# Patient Record
Sex: Female | Born: 1995 | Race: Black or African American | Hispanic: No | Marital: Single | Smoking: Never smoker
Health system: Southern US, Community
[De-identification: ages and names within clinical notes are randomized; demographics above are authoritative.]

## PROBLEM LIST (undated history)

## (undated) DIAGNOSIS — E282 Polycystic ovarian syndrome: Secondary | ICD-10-CM

## (undated) DIAGNOSIS — I10 Essential (primary) hypertension: Secondary | ICD-10-CM

---
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
## 2017-04-12 ENCOUNTER — Encounter (HOSPITAL_COMMUNITY): Payer: Self-pay | Admitting: Emergency Medicine

## 2017-04-12 DIAGNOSIS — R112 Nausea with vomiting, unspecified: Secondary | ICD-10-CM | POA: Diagnosis not present

## 2017-04-12 DIAGNOSIS — R42 Dizziness and giddiness: Secondary | ICD-10-CM | POA: Insufficient documentation

## 2017-04-12 LAB — URINALYSIS, ROUTINE W REFLEX MICROSCOPIC
BILIRUBIN URINE: NEGATIVE
Glucose, UA: NEGATIVE mg/dL
Hgb urine dipstick: NEGATIVE
KETONES UR: NEGATIVE mg/dL
LEUKOCYTES UA: NEGATIVE
NITRITE: NEGATIVE
Protein, ur: NEGATIVE mg/dL
SPECIFIC GRAVITY, URINE: 1.019 (ref 1.005–1.030)
pH: 6 (ref 5.0–8.0)

## 2017-04-12 LAB — PREGNANCY, URINE: PREG TEST UR: NEGATIVE

## 2017-04-12 NOTE — ED Triage Notes (Addendum)
Pt reports she was sitting at the desk and suddenly became dizzy and nauseas.  Pt reports she "might be getting a headache."  No other complaints at this time.

## 2017-04-13 ENCOUNTER — Emergency Department (HOSPITAL_COMMUNITY)
Admission: EM | Admit: 2017-04-13 | Discharge: 2017-04-13 | Disposition: A | Discharge status: Home / Self Care | Payer: Medicaid Other | Attending: Emergency Medicine | Admitting: Emergency Medicine

## 2017-04-13 DIAGNOSIS — R112 Nausea with vomiting, unspecified: Secondary | ICD-10-CM

## 2017-04-13 DIAGNOSIS — R42 Dizziness and giddiness: Secondary | ICD-10-CM

## 2017-04-13 MED ORDER — ONDANSETRON 4 MG PO TBDP: 8.0000 mg | ORAL_TABLET | Freq: Once | ORAL | Status: DC

## 2017-04-13 MED ORDER — ONDANSETRON 4 MG PO TBDP: 4.0000 mg | ORAL_TABLET | Freq: Three times a day (TID) | ORAL | 0 refills | Status: AC | PRN

## 2017-04-13 MED ORDER — IBUPROFEN 400 MG PO TABS: 600.0000 mg | ORAL_TABLET | Freq: Once | ORAL | Status: DC

## 2017-04-13 NOTE — ED Provider Notes (Signed)
[facility] Provider Note   CSN: [id number] Arrival date & time: 04/12/17  2232     History   Chief Complaint Chief Complaint  Patient presents with  . Dizziness  . Nausea    HPI Jody George is a 21 y.o. female who presents with lightheadedness, headache, N/V, and upper abdominal pain. No significant PMH. She states she felt acutely lightheaded while in her dorm room tonight. She is a Consulting civil engineer at [employer]. She started to feel hot and cold and then nauseous as well and had one episode of vomiting. She has a frontal headache and epigastric abdominal pain as well. She had a cold last week but denies any other sick contacts. She denies URI symptoms, chest pain, SOB, cough, lower abdominal pain, diarrhea, dysuria, vaginal discharge.  HPI  History reviewed. No pertinent past medical history.  There are no active problems to display for this patient.   History reviewed. No pertinent surgical history.  OB History    No data available       Home Medications    Prior to Admission medications   Medication Sig Start Date End Date Taking? Authorizing Provider  ondansetron (ZOFRAN ODT) 4 MG disintegrating tablet Take 1 tablet (4 mg total) by mouth every 8 (eight) hours as needed for nausea or vomiting. 04/13/17   Bethel Born, PA-C    Family History No family history on file.  Social History Social History  Substance Use Topics  . Smoking status: Never Smoker  . Smokeless tobacco: Not on file  . Alcohol use No     Allergies   Patient has no known allergies.   Review of Systems Review of Systems  Constitutional:       +hot and cold  HENT: Negative for congestion, ear pain, rhinorrhea and sore throat.   Respiratory: Negative for cough and shortness of breath.   Cardiovascular: Negative for chest pain.  Gastrointestinal: Positive for abdominal pain, nausea and vomiting. Negative for diarrhea.  Genitourinary: Negative for dysuria.  Neurological:  Positive for light-headedness and headaches. Negative for syncope.  All other systems reviewed and are negative.    Physical Exam Updated Vital Signs BP (!) 151/85 (BP Location: Right Arm)   Pulse 87   Temp 97.8 F (36.6 C) (Oral)   Resp 16   Ht 5' 6.5" (1.689 m)   Wt 117.9 kg (260 lb)   LMP 03/16/2017 (Approximate)   SpO2 100%   BMI 41.34 kg/m   Physical Exam  Constitutional: She is oriented to person, place, and time. She appears well-developed and well-nourished. No distress.  Obese female in NAD  HENT:  Head: Normocephalic and atraumatic.  Right Ear: Hearing, tympanic membrane, external ear and ear canal normal.  Left Ear: Hearing, tympanic membrane, external ear and ear canal normal.  Nose: Nose normal.  Mouth/Throat: Uvula is midline, oropharynx is clear and moist and mucous membranes are normal.  Forehead tenderness  Eyes: Pupils are equal, round, and reactive to light. Conjunctivae are normal. Right eye exhibits no discharge. Left eye exhibits no discharge. No scleral icterus.  Neck: Normal range of motion.  Cardiovascular: Normal rate and regular rhythm.  Exam reveals no gallop and no friction rub.   No murmur heard. Pulmonary/Chest: Effort normal and breath sounds normal. No respiratory distress. She has no wheezes. She has no rales. She exhibits no tenderness.  Abdominal: Soft. Bowel sounds are normal. She exhibits no distension and no mass. There is tenderness (epigastric tenderness). There is no rebound  and no guarding. No hernia.  Lymphadenopathy:    She has no cervical adenopathy.  Neurological: She is alert and oriented to person, place, and time.  Skin: Skin is warm and dry.  Psychiatric: She has a normal mood and affect. Her behavior is normal.  Nursing note and vitals reviewed.    ED Treatments / Results  Labs (all labs ordered are listed, but only abnormal results are displayed) Labs Reviewed  URINALYSIS, ROUTINE W REFLEX MICROSCOPIC  PREGNANCY,  URINE    EKG  EKG Interpretation None       Radiology No results found.  Procedures Procedures (including critical care time)  Medications Ordered in ED Medications  ondansetron (ZOFRAN-ODT) disintegrating tablet 8 mg (not administered)  ibuprofen (ADVIL,MOTRIN) tablet 600 mg (not administered)     Initial Impression / Assessment and Plan / ED Course  I have reviewed the triage vital signs and the nursing notes.  Pertinent labs & imaging results that were available during my care of the patient were reviewed by me and considered in my medical decision making (see chart for details).  21 year old female presents with acute onset of lightheadedness, headache, epigastric pain, N/V with hot/cold spells. Likely viral illness. Abdomen is soft, benign. Exam is overall unremarkable. UA is clean and she is not pregnant. Offered fluids, pain meds, antiemetics vs d/c home with OTC meds. She prefers d/c home. Will rx Zofran. Return precautions given.  Final Clinical Impressions(s) / ED Diagnoses   Final diagnoses:  Dizziness  Nausea and vomiting, intractability of vomiting not specified, unspecified vomiting type    New Prescriptions New Prescriptions   ONDANSETRON (ZOFRAN ODT) 4 MG DISINTEGRATING TABLET    Take 1 tablet (4 mg total) by mouth every 8 (eight) hours as needed for nausea or vomiting.     Bethel Born, PA-C 04/13/17 1610    Shon Baton, MD 04/14/17 [phone]

## 2017-04-13 NOTE — ED Notes (Signed)
See providers notes

## 2017-04-13 NOTE — Discharge Instructions (Signed)
Please rest and drink plenty of fluids Take Ibuprofen or Tylenol for pain/fever Take Zofran for nausea Return for worsening symptoms

## 2018-03-08 ENCOUNTER — Ambulatory Visit (HOSPITAL_COMMUNITY)
Admission: EM | Admit: 2018-03-08 | Discharge: 2018-03-08 | Disposition: A | Discharge status: Home / Self Care | Payer: Medicaid Other | Attending: Family Medicine | Admitting: Family Medicine

## 2018-03-08 ENCOUNTER — Other Ambulatory Visit: Payer: Self-pay

## 2018-03-08 ENCOUNTER — Encounter (HOSPITAL_COMMUNITY): Payer: Self-pay | Admitting: Emergency Medicine

## 2018-03-08 DIAGNOSIS — N61 Mastitis without abscess: Secondary | ICD-10-CM

## 2018-03-08 DIAGNOSIS — R030 Elevated blood-pressure reading, without diagnosis of hypertension: Secondary | ICD-10-CM

## 2018-03-08 DIAGNOSIS — N644 Mastodynia: Secondary | ICD-10-CM

## 2018-03-08 MED ORDER — KETOROLAC TROMETHAMINE 30 MG/ML IJ SOLN
INTRAMUSCULAR | Status: AC
  Filled 2018-03-08: qty 1

## 2018-03-08 MED ORDER — IBUPROFEN 600 MG PO TABS: 600.0000 mg | ORAL_TABLET | Freq: Four times a day (QID) | ORAL | 0 refills | Status: AC | PRN

## 2018-03-08 MED ORDER — KETOROLAC TROMETHAMINE 30 MG/ML IJ SOLN
30.0000 mg | Freq: Once | INTRAMUSCULAR | Status: AC
  Administered 2018-03-08: 30 mg via INTRAMUSCULAR

## 2018-03-08 MED ORDER — CLINDAMYCIN HCL 300 MG PO CAPS: 300.0000 mg | ORAL_CAPSULE | Freq: Four times a day (QID) | ORAL | 0 refills | Status: AC

## 2018-03-08 NOTE — ED Provider Notes (Addendum)
[facility]    CSN: [id number] Arrival date & time: 03/08/18  1744     History   Chief Complaint No chief complaint on file.   HPI Jody George is a 22 y.o. female.   The history is provided by the patient. No language interpreter was used.  Abscess  Abscess location: Right breast swelling and pain. Abscess quality: painful, redness and warmth   Abscess quality: not draining, no fluctuance and no itching   Red streaking: no   Duration:  1 week Progression:  Worsening Pain details:    Quality:  Hot and throbbing   Severity:  Severe (8/10 in severity)   Timing:  Constant   Subjective pain progression: Positioning aggravates the pain. Chronicity:  Recurrent (Right breast abscess in the past about 1 yr ago. It resolved without treatment) Context: not diabetes, not injected drug use, not insect bite/sting and not skin injury   Relieved by:  Nothing Exacerbated by: positioning. Ineffective treatments:  NSAIDs (Hot towel and home remedies) Associated symptoms: no headaches   Hypertension  This is a recurrent (Was told she has high BP 2 years ago) problem. The problem occurs rarely. Pertinent negatives include no chest pain, no abdominal pain, no headaches and no shortness of breath. Associated symptoms comments: Intermittent headache. Last was last night. She treated it with Ibuprofen.. Nothing aggravates the symptoms. Nothing relieves the symptoms. Treatments tried: Ibuprofen. The treatment provided significant relief.    No past medical history on file.  There are no active problems to display for this patient.   No past surgical history on file.  OB History   None      Home Medications    Prior to Admission medications   Medication Sig Start Date End Date Taking? Authorizing Provider  ondansetron (ZOFRAN ODT) 4 MG disintegrating tablet Take 1 tablet (4 mg total) by mouth every 8 (eight) hours as needed for nausea or vomiting. 04/13/17    Bethel BornGekas, Kelly Marie, PA-C    Family History No family history on file.  Social History Social History   Tobacco Use  . Smoking status: Never Smoker  Substance Use Topics  . Alcohol use: No  . Drug use: No     Allergies   Patient has no known allergies.   Review of Systems Review of Systems  Respiratory: Negative.  Negative for shortness of breath.   Cardiovascular: Negative.  Negative for chest pain.  Gastrointestinal: Negative for abdominal pain.  Skin:       Right breast swelling and pain.  Neurological: Negative.  Negative for headaches.  All other systems reviewed and are negative.    Physical Exam Triage Vital Signs ED Triage Vitals  Enc Vitals Group     BP      Pulse      Resp      Temp      Temp src      SpO2      Weight      Height      Head Circumference      Peak Flow      Pain Score      Pain Loc      Pain Edu?      Excl. in GC?    No data found.  Updated Vital Signs There were no vitals taken for this visit.  Visual Acuity Right Eye Distance:   Left Eye Distance:   Bilateral Distance:    Right Eye Near:   Left  Eye Near:    Bilateral Near:     Physical Exam  Constitutional: She appears well-developed. No distress.  Cardiovascular: Normal rate, regular rhythm and normal heart sounds.  No murmur heard. Pulmonary/Chest: Effort normal and breath sounds normal. No stridor. No respiratory distress. Right breast exhibits tenderness. Right breast exhibits no inverted nipple, no mass and no nipple discharge. Left breast exhibits no inverted nipple, no mass, no nipple discharge, no skin change and no tenderness. There is breast swelling.    Abdominal: Soft. She exhibits no distension and no mass. There is no tenderness.  Musculoskeletal: She exhibits no edema.  Nursing note and vitals reviewed.    UC Treatments / Results  Labs (all labs ordered are listed, but only abnormal results are displayed) Labs Reviewed - No data to  display  EKG None  Radiology No results found.  Procedures Procedures (including critical care time)  Medications Ordered in UC Medications - No data to display  Initial Impression / Assessment and Plan / UC Course  I have reviewed the triage vital signs and the nursing notes.  Pertinent labs & imaging results that were available during my care of the patient were reviewed by me and considered in my medical decision making (see chart for details).  Clinical Course as of Mar 09 1839  Mon Mar 08, 2018  2130 Right breast cellulitis. Start Clindamycin for 10 days. Ibuprofen 600 mg prn pain. Toradol injection given during this visit. I recommended breast US evaluation especially since this is recurrent. I doubt she will tolerate breast US currently given tenderness She is instructed to establish PCP care for reeval and [place] referral. I gave list of PCP including surgeon for referral. Return precaution discussed. ED visit if symptoms worsens. Patient and her mother agreed with the plan.   [KE]  1837 Initial BP measured was elevated. Likely due to pain. Improved with re-measurement. PCP f/u for monitoring and management recommended.   [KE]    Clinical Course User Index [KE] Doreene Eland, MD    Cellulitis of right breast  Elevated BP without diagnosis of hypertension   Final Clinical Impressions(s) / UC Diagnoses   Final diagnoses:  None   Discharge Instructions   None    ED Prescriptions    None     Controlled Substance Prescriptions [place] Controlled Substance Registry consulted? Not Applicable   Doreene Eland, MD 03/08/18 1840    Doreene Eland, MD 03/08/18 [phone]

## 2018-03-08 NOTE — ED Triage Notes (Signed)
Abscess to right breast.  Patient noticed it late last week.  Abscess is getting larger and more painful each day.  History of the same, but they normally open up by Dorthula Perfecttheslves

## 2018-03-08 NOTE — Discharge Instructions (Addendum)
It was nice seeing you today. Your breast exam indicates that you have breast skin infection without obvious pus collection. Let us treat you with pain medicine and antibiotics. If no improvement in 1 week or symptoms worsens, I will recommend a breast ultrasound. Please establish PCP care for breast US referral or Surgery care for US and drainage if present following US. Use Ibuprofen as needed for pain.  [facility] Address: [address], [place], [place] [address] Phone: [phone]   [facility] Address: [address] [place], [place], [place] [address] Phone: [phone]

## 2020-11-07 ENCOUNTER — Ambulatory Visit
Admission: EM | Admit: 2020-11-07 | Discharge: 2020-11-07 | Disposition: A | Discharge status: Home / Self Care | Payer: BLUE CROSS/BLUE SHIELD | Attending: Family Medicine | Admitting: Family Medicine

## 2020-11-07 ENCOUNTER — Other Ambulatory Visit: Payer: Self-pay

## 2020-11-07 ENCOUNTER — Encounter: Payer: Self-pay | Admitting: Emergency Medicine

## 2020-11-07 DIAGNOSIS — S20212A Contusion of left front wall of thorax, initial encounter: Secondary | ICD-10-CM

## 2020-11-07 MED ORDER — CYCLOBENZAPRINE HCL 5 MG PO TABS: 5.0000 mg | ORAL_TABLET | Freq: Three times a day (TID) | ORAL | 0 refills | Status: AC | PRN

## 2020-11-07 MED ORDER — NAPROXEN 500 MG PO TABS: 500.0000 mg | ORAL_TABLET | Freq: Two times a day (BID) | ORAL | 0 refills | Status: AC | PRN
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
## 2020-11-19 ENCOUNTER — Other Ambulatory Visit: Payer: Self-pay | Admitting: Family Medicine

## 2024-08-12 ENCOUNTER — Encounter (HOSPITAL_BASED_OUTPATIENT_CLINIC_OR_DEPARTMENT_OTHER): Payer: Self-pay | Admitting: Emergency Medicine

## 2024-08-12 ENCOUNTER — Other Ambulatory Visit: Payer: Self-pay

## 2024-08-12 DIAGNOSIS — I10 Essential (primary) hypertension: Secondary | ICD-10-CM | POA: Diagnosis not present

## 2024-08-12 DIAGNOSIS — R519 Headache, unspecified: Secondary | ICD-10-CM | POA: Diagnosis present

## 2024-08-12 NOTE — ED Triage Notes (Signed)
 Headache started around 7 AM  Seen by new PCP today, prescribed new BP meds Nifedipine  has not started yet  Took ibuprofen  around 12  Seen at [facility] and decline med.

## 2024-08-13 ENCOUNTER — Emergency Department (HOSPITAL_BASED_OUTPATIENT_CLINIC_OR_DEPARTMENT_OTHER)
Admission: EM | Admit: 2024-08-13 | Discharge: 2024-08-13 | Disposition: A | Source: Ambulatory Visit | Attending: Emergency Medicine | Admitting: Emergency Medicine

## 2024-08-13 DIAGNOSIS — I10 Essential (primary) hypertension: Secondary | ICD-10-CM

## 2024-08-13 DIAGNOSIS — R519 Headache, unspecified: Secondary | ICD-10-CM

## 2024-08-13 HISTORY — DX: Polycystic ovarian syndrome: E28.2

## 2024-08-13 HISTORY — DX: Essential (primary) hypertension: I10

## 2024-08-13 MED ORDER — KETOROLAC TROMETHAMINE 30 MG/ML IJ SOLN
30.0000 mg | Freq: Once | INTRAMUSCULAR | Status: AC
  Administered 2024-08-13: 30 mg via INTRAMUSCULAR
  Filled 2024-08-13: qty 1

## 2024-08-13 MED ORDER — NIFEDIPINE ER OSMOTIC RELEASE 30 MG PO TB24
30.0000 mg | ORAL_TABLET | Freq: Once | ORAL | Status: AC
  Administered 2024-08-13: 30 mg via ORAL
  Filled 2024-08-13: qty 1

## 2024-08-13 NOTE — ED Provider Notes (Signed)
 " [place] [facility] AT [facility] Provider Note   CSN: [id number] Arrival date & time: 08/12/24  2036     Patient presents with: Headache   Jody George is a 29 y.o. female.   HPI     This is a 29 year old female who presents with concern for headache.  Patient reports that she occasionally gets headaches and this usually correlates with her blood pressure.  She had been off blood pressure medication.  She did see her primary doctor and had a prescription for nifedipine ; however, she was unable to pick it up today.  She continued to have headache despite taking over-the-counter medications.  Describes headache as frontal headache.  No weakness numbness, tingling, speech difficulty.  Prior to Admission medications  Medication Sig Start Date End Date Taking? Authorizing Provider  clindamycin  (CLEOCIN ) 300 MG capsule Take 1 capsule (300 mg total) by mouth every 6 (six) hours. Patient not taking: Reported on 11/07/2020 03/08/18   Anders Otto DASEN, MD  cyclobenzaprine  (FLEXERIL ) 5 MG tablet Take 1 tablet (5 mg total) by mouth 3 (three) times daily as needed for muscle spasms. Do not drink alcohol or drive while taking this medication. May cause drowsiness 11/07/20   Stuart Vernell Norris, PA-C  ibuprofen  (ADVIL ,MOTRIN ) 600 MG tablet Take 1 tablet (600 mg total) by mouth every 6 (six) hours as needed. 03/08/18   Anders Otto DASEN, MD  naproxen  (NAPROSYN ) 500 MG tablet Take 1 tablet (500 mg total) by mouth 2 (two) times daily as needed. 11/07/20   Stuart Vernell Norris, PA-C  NON FORMULARY     [provider]  ondansetron  (ZOFRAN  ODT) 4 MG disintegrating tablet Take 1 tablet (4 mg total) by mouth every 8 (eight) hours as needed for nausea or vomiting. 04/13/17   Odis Burnard Jansky, PA-C    Allergies: Patient has no known allergies.    Review of Systems  Constitutional:  Negative for fever.  Cardiovascular:  Negative for chest pain.  Neurological:   Positive for headaches. Negative for weakness and numbness.  All other systems reviewed and are negative.   Updated Vital Signs BP (!) 144/97   Pulse 80   Temp 98 F (36.7 C) (Oral)   Resp 18   LMP 08/03/2024   SpO2 100%   Physical Exam Vitals and nursing note reviewed.  Constitutional:      Appearance: She is well-developed. She is obese. She is not ill-appearing.  HENT:     Head: Normocephalic and atraumatic.  Eyes:     Pupils: Pupils are equal, round, and reactive to light.  Cardiovascular:     Rate and Rhythm: Normal rate and regular rhythm.     Heart sounds: Normal heart sounds.  Pulmonary:     Effort: Pulmonary effort is normal. No respiratory distress.     Breath sounds: No wheezing.  Abdominal:     General: Bowel sounds are normal.     Palpations: Abdomen is soft.  Musculoskeletal:     Cervical back: Neck supple.  Skin:    General: Skin is warm and dry.  Neurological:     Mental Status: She is alert and oriented to person, place, and time.     Comments: Cranial nerves II through XII intact, 5 out of 5 strength of the face, no dysmetria finger strength     (all labs ordered are listed, but only abnormal results are displayed) Labs Reviewed - No data to display  EKG: None  Radiology: No results found.  Procedures   Medications Ordered in the ED  NIFEdipine  (PROCARDIA -XL/NIFEDICAL-XL) 24 hr tablet 30 mg (30 mg Oral Given 08/13/24 0117)  ketorolac  (TORADOL ) 30 MG/ML injection 30 mg (30 mg Intramuscular Given 08/13/24 0117)                                    Medical Decision Making Risk Prescription drug management.   This patient presents to the ED for concern of headache, this involves an extensive number of treatment options, and is a complaint that carries with it a high risk of complications and morbidity.  I considered the following differential and admission for this acute, potentially life threatening condition.  The differential diagnosis  includes tension headache, migraine, hypertension, hypertensive urgency, hypertensive emergency, less likely CVA  MDM:    This is a 29 year old female who presents with headache.  She is nontoxic and vital signs notable for blood pressure 161/108.  Neurologically intact.  No red flags.  Patient was given Toradol  and a dose of nifedipine .  Blood pressure downtrended and patient had significant improvement of her symptoms.  Normal neuroexam.  Low suspicion for hypertensive urgency or emergency at this time.  Recommend that she restart her blood pressure medication as prescribed by her primary physician.  (Labs, imaging, consults)  Labs: I Ordered, and personally interpreted labs.  The pertinent results include: None  Imaging Studies ordered: I ordered imaging studies including none I independently visualized and interpreted imaging. I agree with the radiologist interpretation  Additional history obtained from chart review.  External records from outside source obtained and reviewed including primary care notes  Cardiac Monitoring: The patient was not maintained on a cardiac monitor.  If on the cardiac monitor, I personally viewed and interpreted the cardiac monitored which showed an underlying rhythm of: N/A  Reevaluation: After the interventions noted above, I reevaluated the patient and found that they have :improved  Social Determinants of Health:  lives independently  Disposition: Discharge  Co morbidities that complicate the patient evaluation  Past Medical History:  Diagnosis Date   Hypertension    PCOS (polycystic ovarian syndrome)      Medicines Meds ordered this encounter  Medications   NIFEdipine  (PROCARDIA -XL/NIFEDICAL-XL) 24 hr tablet 30 mg   ketorolac  (TORADOL ) 30 MG/ML injection 30 mg    I have reviewed the patients home medicines and have made adjustments as needed  Problem List / ED Course: Problem List Items Addressed This Visit   None Visit Diagnoses        Acute nonintractable headache, unspecified headache type    -  Primary   Relevant Medications   NIFEdipine  (PROCARDIA -XL/NIFEDICAL-XL) 24 hr tablet 30 mg (Completed)   ketorolac  (TORADOL ) 30 MG/ML injection 30 mg (Completed)     Essential hypertension       Relevant Medications   NIFEdipine  (PROCARDIA -XL/NIFEDICAL-XL) 24 hr tablet 30 mg (Completed)                Final diagnoses:  Acute nonintractable headache, unspecified headache type  Essential hypertension    ED Discharge Orders     None          Bari Charmaine FALCON, MD 08/13/24 [phone]  "

## 2024-08-13 NOTE — Discharge Instructions (Signed)
 You were seen today for headache.  Your exam is reassuring.  Make sure that you start your blood pressure medications as directed by your primary doctor.
